# Patient Record
Sex: Female | Born: 1975 | Race: White | Hispanic: No | Marital: Married | Smoking: Never smoker
Health system: Southern US, Community
[De-identification: ages and names within clinical notes are randomized; demographics above are authoritative.]

## PROBLEM LIST (undated history)

## (undated) DIAGNOSIS — Z789 Other specified health status: Secondary | ICD-10-CM

## (undated) HISTORY — PX: MOUTH SURGERY: SHX715

## (undated) HISTORY — PX: APPENDECTOMY: SHX54

---

## 2010-06-08 ENCOUNTER — Encounter
Admission: RE | Admit: 2010-06-08 | Discharge: 2010-06-08 | Payer: Self-pay | Source: Home / Self Care | Attending: Obstetrics and Gynecology | Admitting: Obstetrics and Gynecology

## 2010-07-14 ENCOUNTER — Encounter: Payer: Self-pay | Admitting: Obstetrics and Gynecology

## 2012-01-10 LAB — OB RESULTS CONSOLE ANTIBODY SCREEN: Antibody Screen: NEGATIVE

## 2012-01-10 LAB — OB RESULTS CONSOLE ABO/RH

## 2012-01-10 LAB — OB RESULTS CONSOLE RUBELLA ANTIBODY, IGM: Rubella: IMMUNE

## 2012-01-10 LAB — OB RESULTS CONSOLE HEPATITIS B SURFACE ANTIGEN: Hepatitis B Surface Ag: NEGATIVE

## 2012-06-11 ENCOUNTER — Inpatient Hospital Stay (HOSPITAL_COMMUNITY)
Admission: AD | Admit: 2012-06-11 | Discharge: 2012-06-11 | Disposition: A | Discharge status: Home / Self Care | Payer: BC Managed Care – PPO | Source: Ambulatory Visit | Attending: Obstetrics and Gynecology | Admitting: Obstetrics and Gynecology

## 2012-06-11 ENCOUNTER — Encounter (HOSPITAL_COMMUNITY): Payer: Self-pay | Admitting: *Deleted

## 2012-06-11 ENCOUNTER — Inpatient Hospital Stay (HOSPITAL_COMMUNITY): Payer: BC Managed Care – PPO

## 2012-06-11 DIAGNOSIS — Z331 Pregnant state, incidental: Secondary | ICD-10-CM

## 2012-06-11 DIAGNOSIS — W108XXA Fall (on) (from) other stairs and steps, initial encounter: Secondary | ICD-10-CM | POA: Insufficient documentation

## 2012-06-11 DIAGNOSIS — S7000XA Contusion of unspecified hip, initial encounter: Secondary | ICD-10-CM

## 2012-06-11 DIAGNOSIS — S60219A Contusion of unspecified wrist, initial encounter: Secondary | ICD-10-CM

## 2012-06-11 DIAGNOSIS — O99891 Other specified diseases and conditions complicating pregnancy: Secondary | ICD-10-CM | POA: Insufficient documentation

## 2012-06-11 LAB — CBC
HCT: 31.6 % — ABNORMAL LOW (ref 36.0–46.0)
MCV: 91.6 fL (ref 78.0–100.0)
Platelets: 215 10*3/uL (ref 150–400)
RBC: 3.45 MIL/uL — ABNORMAL LOW (ref 3.87–5.11)
RDW: 12.7 % (ref 11.5–15.5)
WBC: 14 10*3/uL — ABNORMAL HIGH (ref 4.0–10.5)

## 2012-06-11 LAB — KLEIHAUER-BETKE STAIN: Quantitation Fetal Hemoglobin: 0 mL

## 2012-06-11 NOTE — MAU Note (Signed)
Fell down the stairs (10 steps) this Am @ 0900;

## 2012-06-11 NOTE — MAU Provider Note (Signed)
  History     CSN: [id number]  Arrival date and time: 06/11/12 1308   None     No chief complaint on file.  HPI  Pt is G1P0 at [redacted] weeks pregnant and fell down about 10 steps this morning.  She hit her right hip and right wrist and left knee.  Pt does not think she hit her abdomen. Pt denies spotting or bleeding. LOF or cramping.She was seen at the office this morning and saw Dr. Rana Snare.    No past medical history on file.  No past surgical history on file.  No family history on file.  History  Substance Use Topics  . Smoking status: Not on file  . Smokeless tobacco: Not on file  . Alcohol Use: Not on file    Allergies: Allergies not on file  No prescriptions prior to admission    Review of Systems  Constitutional: Negative for fever and chills.  HENT: Negative for neck pain.   Gastrointestinal: Negative for nausea, vomiting and abdominal pain.  Genitourinary: Negative for dysuria, urgency, frequency and hematuria.  Musculoskeletal: Positive for back pain.  Neurological: Negative for dizziness.   Physical Exam   There were no vitals taken for this visit.  Physical Exam  Vitals reviewed. Constitutional: She is oriented to person, place, and time. She appears well-developed and well-nourished.  HENT:  Head: Normocephalic and atraumatic.  Eyes: Pupils are equal, round, and reactive to light.  Neck: Normal range of motion. Neck supple.  Cardiovascular: Normal rate.   Respiratory: Effort normal.  GI: Soft. She exhibits no distension. There is no tenderness. There is no rebound and no guarding.  Musculoskeletal: Normal range of motion.  Neurological: She is alert and oriented to person, place, and time.  Skin: Skin is warm and dry.  Psychiatric: She has a normal mood and affect.    MAU Course  Procedures  Discussed with Dr. Rana Snare- will continue to monitor pt for the 4 hours Results for orders placed during the hospital encounter of 06/11/12 (from the past 24  hour(s))  CBC     Status: Abnormal   Collection Time   06/11/12  1:41 PM      Component Value Range   WBC 14.0 (*) 4.0 - 10.5 K/uL   RBC 3.45 (*) 3.87 - 5.11 MIL/uL   Hemoglobin 10.7 (*) 12.0 - 15.0 g/dL   HCT 40.9 (*) 81.1 - 91.4 %   MCV 91.6  78.0 - 100.0 fL   MCH 31.0  26.0 - 34.0 pg   MCHC 33.9  30.0 - 36.0 g/dL   RDW 78.2  95.6 - 21.3 %   Platelets 215  150 - 400 K/uL  KLEIHAUER-BETKE STAIN     Status: Normal   Collection Time   06/11/12  1:41 PM      Component Value Range   Fetal Cells % 0.0     Quantitation Fetal Hemoglobin 0    Fetal monitoring for 4 hours- occ ctx; FHR reassuring for gestational age Pt's right hip is sore and right wrist Pt does not want to take any medication for the pain  Assessment and Plan  Fall down steps Normal ultrasound Neg KB stain Tylenol prn F/u with Dr. Rana Snare for [facility] appointment tomorrow- call if increase in pain Discharge instructions given on fall in pregnancy and kick counts  LINEBERRY,SUSAN 06/11/2012, 1:38 PM

## 2012-07-29 ENCOUNTER — Encounter (HOSPITAL_COMMUNITY): Payer: Self-pay | Admitting: Pharmacist

## 2012-08-04 NOTE — H&P (Signed)
Valerie Henson  DICTATION # [id number] CSN# [id number]   Meriel Pica, MD 08/04/2012 12:34 PM

## 2012-08-05 NOTE — H&P (Signed)
Valerie Henson, RUDNICK               ACCOUNT NO.:  [id number]  MEDICAL RECORD NO.:  [id number]  LOCATION:                                 FACILITY:  PHYSICIAN:  Duke Salvia. Marcelle Overlie, M.D.DATE OF BIRTH:  21-Oct-1975  DATE OF ADMISSION: DATE OF DISCHARGE:                             HISTORY & PHYSICAL   CHIEF COMPLAINT:  Primary cesarean section at term, history of HSV.  HISTORY:  A 37 year old, G1, P0, EDD August 15, 2012.  This patient has had a normal maternal T21 genetic screen and CF screen, otherwise uncomplicated pregnancy, has been on oral Valtrex daily for HSV suppression.  Despite, no active outbreaks with this pregnancy.  She has a strong preference for primary cesarean section, mainly because of concerns about HSV.  Specific risks related to bleeding, infection, transfusion, adjacent organ injury, wound infection, phlebitis along with her expected recovery time reviewed with her.  She remains firm in her decision to proceed with primary cesarean section.  PAST MEDICAL HISTORY:  Allergies none.  Current medications; Valtrex, prenatal vitamins.  Please see the [facility] form for the remainder of her past medical history.  PHYSICAL EXAMINATION:  VITAL SIGNS:  Temperature 98.2, blood pressure 120/78. HEENT:  Unremarkable. NECK:  Supple without masses. LUNGS:  Clear. CARDIOVASCULAR:  Regular rate and rhythm without murmurs, rubs, gallops. BREASTS:  Without masses. ABDOMEN:  Term fundal height.  Fetal heart rate 140.  Cervix was closed, bulbar exam unremarkable. EXTREMITIES:  Unremarkable. NEUROLOGIC:  Unremarkable.  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  PLAN:  Primary CS procedure and risks discussed as above.     Takeela Peil M. Marcelle Overlie, M.D.     [facility]/MEDQ  D:  08/04/2012  T:  08/04/2012  Job:  [id number]

## 2012-08-07 ENCOUNTER — Encounter (HOSPITAL_COMMUNITY): Payer: Self-pay

## 2012-08-10 ENCOUNTER — Encounter (HOSPITAL_COMMUNITY)
Admission: RE | Admit: 2012-08-10 | Discharge: 2012-08-10 | Disposition: A | Discharge status: Home / Self Care | Payer: BC Managed Care – PPO | Source: Ambulatory Visit | Attending: Obstetrics and Gynecology | Admitting: Obstetrics and Gynecology

## 2012-08-10 ENCOUNTER — Encounter (HOSPITAL_COMMUNITY): Payer: Self-pay

## 2012-08-10 HISTORY — DX: Other specified health status: Z78.9

## 2012-08-10 LAB — TYPE AND SCREEN: ABO/RH(D): A POS

## 2012-08-10 LAB — CBC
MCH: 28.9 pg (ref 26.0–34.0)
MCHC: 32.8 g/dL (ref 30.0–36.0)
RDW: 14.2 % (ref 11.5–15.5)

## 2012-08-10 LAB — RPR: RPR Ser Ql: NONREACTIVE

## 2012-08-10 NOTE — Patient Instructions (Addendum)
   Your procedure is scheduled on:Thursday February 20th  Enter through the Main Entrance of [facility] at:6am Pick up the phone at the desk and dial [phone] and inform us of your arrival.  Please call this number if you have any problems the morning of surgery: [phone]  Remember: Do not eat or drink anything after midnight on Wednesday :  Do not wear jewelry, make-up, or FINGER nail polish No metal in your hair or on your body. Do not wear lotions, powders, perfumes. You may wear deodorant.  Please use your CHG wash as directed prior to surgery.  Do not shave anywhere for at least 12 hours prior to first CHG shower.  Do not bring valuables to the hospital.   Leave suitcase in the car. After Surgery it may be brought to your room. For patients being admitted to the hospital, checkout time is 11:00am the day of discharge.      

## 2012-08-12 ENCOUNTER — Encounter (HOSPITAL_COMMUNITY): Payer: Self-pay | Admitting: Anesthesiology

## 2012-08-12 NOTE — Anesthesia Preprocedure Evaluation (Addendum)
Anesthesia Evaluation  Patient identified by MRN, date of birth, ID band Patient awake    Reviewed: Allergy & Precautions, H&P , NPO status , Patient's Chart, lab work & pertinent test results  Airway Mallampati: II TM Distance: >3 FB Neck ROM: Full    Dental no notable dental hx. (+) Teeth Intact   Pulmonary neg pulmonary ROS,  breath sounds clear to auscultation  Pulmonary exam normal       Cardiovascular negative cardio ROS  Rhythm:Regular Rate:Normal     Neuro/Psych negative neurological ROS  negative psych ROS   GI/Hepatic negative GI ROS, Neg liver ROS,   Endo/Other  negative endocrine ROS  Renal/GU negative Renal ROS  negative genitourinary   Musculoskeletal negative musculoskeletal ROS (+)   Abdominal Normal abdominal exam  (+)   Peds  Hematology negative hematology ROS (+)   Anesthesia Other Findings   Reproductive/Obstetrics (+) Pregnancy HSV Maternal request for C/Section                          Anesthesia Physical Anesthesia Plan  ASA: II  Anesthesia Plan: Spinal   Post-op Pain Management:    Induction:   Airway Management Planned: Natural Airway  Additional Equipment:   Intra-op Plan:   Post-operative Plan:   Informed Consent: I have reviewed the patients History and Physical, chart, labs and discussed the procedure including the risks, benefits and alternatives for the proposed anesthesia with the patient or authorized representative who has indicated his/her understanding and acceptance.   Dental advisory given  Plan Discussed with: Anesthesiologist, CRNA and Surgeon  Anesthesia Plan Comments:        Anesthesia Quick Evaluation

## 2012-08-13 ENCOUNTER — Inpatient Hospital Stay (HOSPITAL_COMMUNITY)
Admission: AD | Admit: 2012-08-13 | Discharge: 2012-08-16 | DRG: 371 | Disposition: A | Discharge status: Home / Self Care | Payer: BC Managed Care – PPO | Source: Ambulatory Visit | Attending: Obstetrics and Gynecology | Admitting: Obstetrics and Gynecology

## 2012-08-13 ENCOUNTER — Inpatient Hospital Stay (HOSPITAL_COMMUNITY): Payer: BC Managed Care – PPO | Admitting: Anesthesiology

## 2012-08-13 ENCOUNTER — Encounter (HOSPITAL_COMMUNITY)
Admission: AD | Disposition: A | Discharge status: Home / Self Care | Payer: Self-pay | Source: Ambulatory Visit | Attending: Obstetrics and Gynecology

## 2012-08-13 ENCOUNTER — Encounter (HOSPITAL_COMMUNITY): Payer: Self-pay | Admitting: Anesthesiology

## 2012-08-13 ENCOUNTER — Encounter (HOSPITAL_COMMUNITY): Payer: Self-pay | Admitting: *Deleted

## 2012-08-13 DIAGNOSIS — A6000 Herpesviral infection of urogenital system, unspecified: Secondary | ICD-10-CM | POA: Diagnosis present

## 2012-08-13 DIAGNOSIS — O98519 Other viral diseases complicating pregnancy, unspecified trimester: Principal | ICD-10-CM | POA: Diagnosis present

## 2012-08-13 SURGERY — Surgical Case
Anesthesia: Spinal | Site: Abdomen | Wound class: Clean Contaminated

## 2012-08-13 MED ORDER — NALBUPHINE HCL 10 MG/ML IJ SOLN
5.0000 mg | INTRAMUSCULAR | Status: DC | PRN
  Administered 2012-08-13: 5 mg via SUBCUTANEOUS
  Filled 2012-08-13: qty 1

## 2012-08-13 MED ORDER — SIMETHICONE 80 MG PO CHEW
80.0000 mg | CHEWABLE_TABLET | Freq: Three times a day (TID) | ORAL | Status: DC
  Administered 2012-08-13 – 2012-08-16 (×8): 80 mg via ORAL

## 2012-08-13 MED ORDER — ONDANSETRON HCL 4 MG/2ML IJ SOLN
INTRAMUSCULAR | Status: DC | PRN
  Administered 2012-08-13: 4 mg via INTRAVENOUS

## 2012-08-13 MED ORDER — MENTHOL 3 MG MT LOZG: 1.0000 | LOZENGE | OROMUCOSAL | Status: DC | PRN

## 2012-08-13 MED ORDER — 0.9 % SODIUM CHLORIDE (POUR BTL) OPTIME
TOPICAL | Status: DC | PRN
  Administered 2012-08-13: 1000 mL

## 2012-08-13 MED ORDER — DIBUCAINE 1 % RE OINT
1.0000 "application " | TOPICAL_OINTMENT | RECTAL | Status: DC | PRN
  Administered 2012-08-15: 1 via RECTAL
  Filled 2012-08-13: qty 28

## 2012-08-13 MED ORDER — FENTANYL CITRATE 0.05 MG/ML IJ SOLN: 25.0000 ug | INTRAMUSCULAR | Status: DC | PRN

## 2012-08-13 MED ORDER — EPHEDRINE SULFATE 50 MG/ML IJ SOLN
INTRAMUSCULAR | Status: DC | PRN
  Administered 2012-08-13: 5 mg via INTRAVENOUS
  Administered 2012-08-13 (×2): 10 mg via INTRAVENOUS

## 2012-08-13 MED ORDER — PHENYLEPHRINE HCL 10 MG/ML IJ SOLN
INTRAMUSCULAR | Status: DC | PRN
  Administered 2012-08-13 (×2): 80 ug via INTRAVENOUS

## 2012-08-13 MED ORDER — TETANUS-DIPHTH-ACELL PERTUSSIS 5-2.5-18.5 LF-MCG/0.5 IM SUSP: 0.5000 mL | Freq: Once | INTRAMUSCULAR | Status: DC

## 2012-08-13 MED ORDER — IBUPROFEN 800 MG PO TABS
800.0000 mg | ORAL_TABLET | Freq: Three times a day (TID) | ORAL | Status: DC | PRN
  Administered 2012-08-14: 800 mg via ORAL
  Filled 2012-08-13: qty 1

## 2012-08-13 MED ORDER — LACTATED RINGERS IV SOLN
INTRAVENOUS | Status: DC | PRN
  Administered 2012-08-13: 08:00:00 via INTRAVENOUS

## 2012-08-13 MED ORDER — NALOXONE HCL 0.4 MG/ML IJ SOLN: 0.4000 mg | INTRAMUSCULAR | Status: DC | PRN

## 2012-08-13 MED ORDER — FENTANYL CITRATE 0.05 MG/ML IJ SOLN
INTRAMUSCULAR | Status: DC | PRN
  Administered 2012-08-13: 25 ug via INTRATHECAL

## 2012-08-13 MED ORDER — BISACODYL 10 MG RE SUPP
10.0000 mg | Freq: Every day | RECTAL | Status: DC | PRN
  Administered 2012-08-15: 10 mg via RECTAL
  Filled 2012-08-13: qty 1

## 2012-08-13 MED ORDER — KETOROLAC TROMETHAMINE 30 MG/ML IJ SOLN
INTRAMUSCULAR | Status: AC
  Administered 2012-08-13: 30 mg via INTRAMUSCULAR
  Filled 2012-08-13: qty 1

## 2012-08-13 MED ORDER — BUPIVACAINE IN DEXTROSE 0.75-8.25 % IT SOLN
INTRATHECAL | Status: DC | PRN
  Administered 2012-08-13: 1.6 mL via INTRATHECAL

## 2012-08-13 MED ORDER — CEFAZOLIN SODIUM-DEXTROSE 2-3 GM-% IV SOLR
2.0000 g | INTRAVENOUS | Status: AC
  Administered 2012-08-13: 2 g via INTRAVENOUS

## 2012-08-13 MED ORDER — NALOXONE HCL 1 MG/ML IJ SOLN
1.0000 ug/kg/h | INTRAVENOUS | Status: DC | PRN
  Filled 2012-08-13: qty 2

## 2012-08-13 MED ORDER — OXYTOCIN 40 UNITS IN LACTATED RINGERS INFUSION - SIMPLE MED: 62.5000 mL/h | INTRAVENOUS | Status: AC

## 2012-08-13 MED ORDER — MEASLES, MUMPS & RUBELLA VAC ~~LOC~~ INJ: 0.5000 mL | INJECTION | Freq: Once | SUBCUTANEOUS | Status: DC

## 2012-08-13 MED ORDER — LACTATED RINGERS IV SOLN
INTRAVENOUS | Status: DC
  Administered 2012-08-13: 07:00:00 via INTRAVENOUS
  Administered 2012-08-13: 1000 mL via INTRAVENOUS
  Administered 2012-08-13: 08:00:00 via INTRAVENOUS

## 2012-08-13 MED ORDER — MORPHINE SULFATE 0.5 MG/ML IJ SOLN
INTRAMUSCULAR | Status: AC
  Filled 2012-08-13: qty 10

## 2012-08-13 MED ORDER — SCOPOLAMINE 1 MG/3DAYS TD PT72
MEDICATED_PATCH | TRANSDERMAL | Status: AC
  Filled 2012-08-13: qty 1

## 2012-08-13 MED ORDER — IBUPROFEN 600 MG PO TABS
600.0000 mg | ORAL_TABLET | Freq: Four times a day (QID) | ORAL | Status: DC | PRN
  Administered 2012-08-15 – 2012-08-16 (×6): 600 mg via ORAL
  Filled 2012-08-13 (×8): qty 1

## 2012-08-13 MED ORDER — DIPHENHYDRAMINE HCL 25 MG PO CAPS
25.0000 mg | ORAL_CAPSULE | ORAL | Status: DC | PRN
  Filled 2012-08-13: qty 1

## 2012-08-13 MED ORDER — OXYTOCIN 40 UNITS IN LACTATED RINGERS INFUSION - SIMPLE MED
INTRAVENOUS | Status: DC | PRN
  Administered 2012-08-13: 40 [IU] via INTRAVENOUS

## 2012-08-13 MED ORDER — METOCLOPRAMIDE HCL 5 MG/ML IJ SOLN: 10.0000 mg | Freq: Three times a day (TID) | INTRAMUSCULAR | Status: DC | PRN

## 2012-08-13 MED ORDER — MORPHINE SULFATE (PF) 0.5 MG/ML IJ SOLN
INTRAMUSCULAR | Status: DC | PRN
  Administered 2012-08-13: .1 mg via INTRATHECAL

## 2012-08-13 MED ORDER — DIPHENHYDRAMINE HCL 50 MG/ML IJ SOLN: 25.0000 mg | INTRAMUSCULAR | Status: DC | PRN

## 2012-08-13 MED ORDER — SENNOSIDES-DOCUSATE SODIUM 8.6-50 MG PO TABS
2.0000 | ORAL_TABLET | Freq: Every day | ORAL | Status: DC
  Administered 2012-08-13 – 2012-08-15 (×3): 2 via ORAL

## 2012-08-13 MED ORDER — ONDANSETRON HCL 4 MG/2ML IJ SOLN: 4.0000 mg | Freq: Three times a day (TID) | INTRAMUSCULAR | Status: DC | PRN

## 2012-08-13 MED ORDER — OXYTOCIN 10 UNIT/ML IJ SOLN
INTRAMUSCULAR | Status: AC
  Filled 2012-08-13: qty 4

## 2012-08-13 MED ORDER — LACTATED RINGERS IV SOLN
INTRAVENOUS | Status: DC
  Administered 2012-08-13: 17:00:00 via INTRAVENOUS

## 2012-08-13 MED ORDER — PRENATAL MULTIVITAMIN CH
1.0000 | ORAL_TABLET | Freq: Every day | ORAL | Status: DC
  Administered 2012-08-14 – 2012-08-16 (×3): 1 via ORAL
  Filled 2012-08-13 (×3): qty 1

## 2012-08-13 MED ORDER — DIPHENHYDRAMINE HCL 50 MG/ML IJ SOLN: 12.5000 mg | INTRAMUSCULAR | Status: DC | PRN

## 2012-08-13 MED ORDER — WITCH HAZEL-GLYCERIN EX PADS
1.0000 "application " | MEDICATED_PAD | CUTANEOUS | Status: DC | PRN
  Administered 2012-08-15: 1 via TOPICAL

## 2012-08-13 MED ORDER — SODIUM CHLORIDE 0.9 % IJ SOLN: 3.0000 mL | INTRAMUSCULAR | Status: DC | PRN

## 2012-08-13 MED ORDER — SIMETHICONE 80 MG PO CHEW: 80.0000 mg | CHEWABLE_TABLET | ORAL | Status: DC | PRN

## 2012-08-13 MED ORDER — DIPHENHYDRAMINE HCL 25 MG PO CAPS: 25.0000 mg | ORAL_CAPSULE | Freq: Four times a day (QID) | ORAL | Status: DC | PRN

## 2012-08-13 MED ORDER — SODIUM CHLORIDE 0.9 % IJ SOLN: 3.0000 mL | Freq: Two times a day (BID) | INTRAMUSCULAR | Status: DC

## 2012-08-13 MED ORDER — ONDANSETRON HCL 4 MG/2ML IJ SOLN
INTRAMUSCULAR | Status: AC
  Filled 2012-08-13: qty 2

## 2012-08-13 MED ORDER — NALBUPHINE SYRINGE 5 MG/0.5 ML
INJECTION | INTRAMUSCULAR | Status: AC
  Administered 2012-08-13: 5 mg via SUBCUTANEOUS
  Filled 2012-08-13: qty 1

## 2012-08-13 MED ORDER — FENTANYL CITRATE 0.05 MG/ML IJ SOLN
INTRAMUSCULAR | Status: AC
  Filled 2012-08-13: qty 2

## 2012-08-13 MED ORDER — SCOPOLAMINE 1 MG/3DAYS TD PT72
1.0000 | MEDICATED_PATCH | Freq: Once | TRANSDERMAL | Status: DC
  Administered 2012-08-13: 1.5 mg via TRANSDERMAL

## 2012-08-13 MED ORDER — FLEET ENEMA 7-19 GM/118ML RE ENEM: 1.0000 | ENEMA | Freq: Every day | RECTAL | Status: DC | PRN

## 2012-08-13 MED ORDER — LANOLIN HYDROUS EX OINT: 1.0000 "application " | TOPICAL_OINTMENT | CUTANEOUS | Status: DC | PRN

## 2012-08-13 MED ORDER — KETOROLAC TROMETHAMINE 30 MG/ML IJ SOLN
30.0000 mg | Freq: Four times a day (QID) | INTRAMUSCULAR | Status: DC | PRN
  Administered 2012-08-13: 30 mg via INTRAMUSCULAR

## 2012-08-13 MED ORDER — NALBUPHINE HCL 10 MG/ML IJ SOLN
5.0000 mg | INTRAMUSCULAR | Status: DC | PRN
  Filled 2012-08-13: qty 1

## 2012-08-13 MED ORDER — SCOPOLAMINE 1 MG/3DAYS TD PT72: 1.0000 | MEDICATED_PATCH | Freq: Once | TRANSDERMAL | Status: DC

## 2012-08-13 MED ORDER — OXYCODONE-ACETAMINOPHEN 5-325 MG PO TABS
1.0000 | ORAL_TABLET | Freq: Four times a day (QID) | ORAL | Status: DC | PRN
  Filled 2012-08-13: qty 1

## 2012-08-13 MED ORDER — ACETAMINOPHEN 10 MG/ML IV SOLN
1000.0000 mg | Freq: Four times a day (QID) | INTRAVENOUS | Status: AC
  Filled 2012-08-13 (×4): qty 100

## 2012-08-13 MED ORDER — PHENYLEPHRINE 40 MCG/ML (10ML) SYRINGE FOR IV PUSH (FOR BLOOD PRESSURE SUPPORT)
PREFILLED_SYRINGE | INTRAVENOUS | Status: AC
  Filled 2012-08-13: qty 5

## 2012-08-13 MED ORDER — CEFAZOLIN SODIUM-DEXTROSE 2-3 GM-% IV SOLR
INTRAVENOUS | Status: AC
  Filled 2012-08-13: qty 50

## 2012-08-13 MED ORDER — ZOLPIDEM TARTRATE 5 MG PO TABS: 5.0000 mg | ORAL_TABLET | Freq: Every evening | ORAL | Status: DC | PRN

## 2012-08-13 MED ORDER — MEPERIDINE HCL 25 MG/ML IJ SOLN: 6.2500 mg | INTRAMUSCULAR | Status: DC | PRN

## 2012-08-13 MED ORDER — SODIUM CHLORIDE 0.9 % IV SOLN: 250.0000 mL | INTRAVENOUS | Status: DC

## 2012-08-13 MED ORDER — EPHEDRINE 5 MG/ML INJ
INTRAVENOUS | Status: AC
  Filled 2012-08-13: qty 10

## 2012-08-13 SURGICAL SUPPLY — 27 items
CLOTH BEACON ORANGE TIMEOUT ST (SAFETY) ×2 IMPLANT
DRAPE LG THREE QUARTER DISP (DRAPES) ×2 IMPLANT
DRESSING TELFA 8X3 (GAUZE/BANDAGES/DRESSINGS) IMPLANT
DRSG OPSITE POSTOP 4X10 (GAUZE/BANDAGES/DRESSINGS) ×2 IMPLANT
DURAPREP 26ML APPLICATOR (WOUND CARE) ×2 IMPLANT
ELECT REM PT RETURN 9FT ADLT (ELECTROSURGICAL) ×2
ELECTRODE REM PT RTRN 9FT ADLT (ELECTROSURGICAL) ×1 IMPLANT
EXTRACTOR VACUUM M CUP 4 TUBE (SUCTIONS) IMPLANT
GAUZE SPONGE 4X4 12PLY STRL LF (GAUZE/BANDAGES/DRESSINGS) IMPLANT
GLOVE BIO SURGEON STRL SZ7 (GLOVE) ×4 IMPLANT
GOWN PREVENTION PLUS LG XLONG (DISPOSABLE) ×6 IMPLANT
KIT ABG SYR 3ML LUER SLIP (SYRINGE) IMPLANT
NEEDLE HYPO 25X5/8 SAFETYGLIDE (NEEDLE) IMPLANT
NS IRRIG 1000ML POUR BTL (IV SOLUTION) ×2 IMPLANT
PACK C SECTION WH (CUSTOM PROCEDURE TRAY) ×2 IMPLANT
PAD ABD 7.5X8 STRL (GAUZE/BANDAGES/DRESSINGS) IMPLANT
PAD OB MATERNITY 4.3X12.25 (PERSONAL CARE ITEMS) ×2 IMPLANT
SLEEVE SCD COMPRESS KNEE MED (MISCELLANEOUS) IMPLANT
STRIP CLOSURE SKIN 1/2X4 (GAUZE/BANDAGES/DRESSINGS) IMPLANT
SUT CHROMIC 0 CTX 36 (SUTURE) ×8 IMPLANT
SUT MON AB 4-0 PS1 27 (SUTURE) ×2 IMPLANT
SUT PDS AB 0 CT1 27 (SUTURE) ×4 IMPLANT
SUT VIC AB 3-0 CT1 27 (SUTURE) ×2
SUT VIC AB 3-0 CT1 TAPERPNT 27 (SUTURE) ×2 IMPLANT
TOWEL OR 17X24 6PK STRL BLUE (TOWEL DISPOSABLE) ×6 IMPLANT
TRAY FOLEY CATH 14FR (SET/KITS/TRAYS/PACK) ×2 IMPLANT
WATER STERILE IRR 1000ML POUR (IV SOLUTION) IMPLANT

## 2012-08-13 NOTE — Anesthesia Procedure Notes (Signed)
Spinal  Patient location during procedure: OR Start time: 08/13/2012 7:23 AM Staffing Anesthesiologist: Angus Seller., Harrell Gave. Performed by: anesthesiologist  Preanesthetic Checklist Completed: patient identified, site marked, surgical consent, pre-op evaluation, timeout performed, IV checked, risks and benefits discussed and monitors and equipment checked Spinal Block Patient position: sitting Prep: DuraPrep Patient monitoring: heart rate, cardiac monitor, continuous pulse ox and blood pressure Approach: midline Location: L3-4 Injection technique: single-shot Needle Needle type: Sprotte  Needle gauge: 24 G Needle length: 9 cm Assessment Sensory level: T4 Additional Notes Patient identified.  Risk benefits discussed including failed block, incomplete pain control, headache, nerve damage, paralysis, blood pressure changes, nausea, vomiting, reactions to medication both toxic or allergic, and postpartum back pain.  Patient expressed understanding and wished to proceed.  All questions were answered.  Sterile technique used throughout procedure.  CSF was clear.  No parasthesia or other complications.  Please see nursing notes for vital signs.

## 2012-08-13 NOTE — Anesthesia Postprocedure Evaluation (Signed)
  Anesthesia Post Note  Patient: Valerie Henson  Procedure(s) Performed: Procedure(s) (LRB): CESAREAN SECTION (N/A)  Anesthesia type: Spinal  Patient location: PACU  Post pain: Pain level controlled  Post assessment: Post-op Vital signs reviewed  Last Vitals:  Filed Vitals:   08/13/12 0621  BP: 112/73  Pulse: 77  Temp: 37 C    Post vital signs: Reviewed  Level of consciousness: awake  Complications: No apparent anesthesia complications

## 2012-08-13 NOTE — Transfer of Care (Signed)
Immediate Anesthesia Transfer of Care Note  Patient: Valerie Henson  Procedure(s) Performed: Procedure(s) with comments: CESAREAN SECTION (N/A) - Primary Cesarean Section Delivery Baby     @    , Apgars  Patient Location: PACU  Anesthesia Type:Spinal  Level of Consciousness: awake, alert  and oriented  Airway & Oxygen Therapy: Patient Spontanous Breathing  Post-op Assessment: Report given to PACU RN and Post -op Vital signs reviewed and stable  Post vital signs: Reviewed and stable  Complications: No apparent anesthesia complications

## 2012-08-13 NOTE — Progress Notes (Signed)
The patient was re-examined with no change in status 

## 2012-08-13 NOTE — Anesthesia Postprocedure Evaluation (Signed)
  Anesthesia Post-op Note  Patient: Valerie Henson  Procedure(s) Performed: Procedure(s) with comments: CESAREAN SECTION (N/A) - Primary Cesarean Section Delivery Baby Girl  @ [phone],    Apgars 9/9  Patient Location: PACU and Mother/Baby  Anesthesia Type:Spinal  Level of Consciousness: awake, alert , oriented and patient cooperative  Airway and Oxygen Therapy: Patient Spontanous Breathing  Post-op Pain: none  Post-op Assessment: Post-op Vital signs reviewed, No signs of Nausea or vomiting, Adequate PO intake, Pain level controlled, No headache, No backache, No residual numbness and No residual motor weakness  Post-op Vital Signs: Reviewed and stable  Complications: No apparent anesthesia complications

## 2012-08-13 NOTE — Op Note (Signed)
Preoperative diagnosis: Term pregnancy, history of HSV, patient preference for cesarean delivery  Postoperative diagnosis: Same  Procedure: Primary low transverse cesarean section  Surgeon: Marcelle Overlie  Anesthesia: Spinal  EBL: 700 cc  Drains: Foley catheter  Procedure and findings:  The patient taken the operating room after an adequate level of spinal anesthetic was obtainedin left tilt position the abdomen prepped and draped in usual fashion with the patient for cesarean section. She did receive preop IV Ancef. Appropriate timeout for taken at that point. Transverse Pfannenstiel incision made 2 finger breaths above the symphysis carried down to the fascia which was incised and extended transversely. Rectus muscle divided in the midline peritoneum entered superiorly without incident and extended in a vertical fashion. The vesicouterine serosa was incised and the bladder was bluntly and sharply dissected below. Bladder blade was repositioned. Transverse incision made in the lower uterine segment extended with bandage scissors clear fluid noted the patient delivered of a healthy female Apgars 9 and 9 loose nuchal cord times one from the vertex presentation. The infant was suctioned cord clamped and passed the pediatric team for further care. Cord blood was then obtained for private CBR per patient's request. Once this was completed placenta was then removed manually intact, uterus exteriorized cavity wiped clean with laparotomy pack closure obtained with a first layer of 0 chromic in a locked fashion followed by Dimetane layer of chromic. This is hemostatic bilateral tubes and ovaries were normal. The bladder flap area was intact and hemostatic. Prior to closure sponge denies precast reported as correct x2. Peritoneum closed the running 2-0 Vicryl suture. 2-0 Vicryl running suture used to close the rectus muscles in the midline. 0 PDS suture was then used to close the fascia from laterally to midline on  either side. Subcutaneous tissue was less than 2 cm and was hemostatic 4-0 Monocryl subcuticular closure with sterile dressing applied she tolerated this well went to recovery room in good condition.  Dictated with [facility]  Taydem Cavagnaro M. Milana Obey.D.

## 2012-08-14 ENCOUNTER — Encounter (HOSPITAL_COMMUNITY): Payer: Self-pay | Admitting: Obstetrics and Gynecology

## 2012-08-14 LAB — CBC
Hemoglobin: 10.8 g/dL — ABNORMAL LOW (ref 12.0–15.0)
MCH: 28.5 pg (ref 26.0–34.0)
MCHC: 32.6 g/dL (ref 30.0–36.0)
RDW: 14.4 % (ref 11.5–15.5)

## 2012-08-14 NOTE — Progress Notes (Signed)
Subjective: Postpartum Day 1: Cesarean Delivery Patient reports tolerating PO, + flatus and no problems voiding.    Objective: Vital signs in last 24 hours: Temp:  [97.7 F (36.5 C)-99.7 F (37.6 C)] 99 F (37.2 C) (02/21 0556) Pulse Rate:  [58-92] 62 (02/21 0556) Resp:  [16-20] 18 (02/21 0556) BP: (95-115)/(32-87) 96/59 mmHg (02/21 0556) SpO2:  [94 %-99 %] 94 % (02/21 0556)  Physical Exam:  General: alert and cooperative Lochia: appropriate Uterine Fundus: firm Incision: honeycomb dressing CDI DVT Evaluation: No evidence of DVT seen on physical exam. Negative Homan's sign. No cords or calf tenderness. No significant calf/ankle edema.   Recent Labs  08/14/12 0500  HGB 10.8*  HCT 33.1*    Assessment/Plan: Status post Cesarean section. Doing well postoperatively.  Continue current care.  Alexa Blish G 08/14/2012, 7:57 AM

## 2012-08-15 MED ORDER — MAGNESIUM OXIDE 400 (241.3 MG) MG PO TABS
400.0000 mg | ORAL_TABLET | Freq: Every day | ORAL | Status: DC
  Administered 2012-08-15: 400 mg via ORAL
  Filled 2012-08-15 (×2): qty 1

## 2012-08-15 NOTE — Progress Notes (Signed)
Subjective: Postpartum Day 2: Cesarean Delivery Patient reports tolerating PO and no problems voiding.    Objective: Vital signs in last 24 hours: Temp:  [97.9 F (36.6 C)-98.6 F (37 C)] 97.9 F (36.6 C) (02/22 0619) Pulse Rate:  [59-85] 59 (02/22 0619) Resp:  [16-18] 18 (02/22 0619) BP: (106-110)/(74-77) 110/74 mmHg (02/22 0619) SpO2:  [95 %-98 %] 98 % (02/22 7829)  Physical Exam:  General: alert and cooperative Lochia: appropriate Uterine Fundus: firm Incision: healing well, no significant drainage DVT Evaluation: No evidence of DVT seen on physical exam.   Recent Labs  08/14/12 0500  HGB 10.8*  HCT 33.1*    Assessment/Plan: Status post Cesarean section. Doing well postoperatively.  Continue current care.  Ani Deoliveira 08/15/2012, 6:45 AM

## 2012-08-16 MED ORDER — IBUPROFEN 600 MG PO TABS: 600.0000 mg | ORAL_TABLET | Freq: Four times a day (QID) | ORAL | Status: AC | PRN

## 2012-08-16 MED ORDER — OXYCODONE-ACETAMINOPHEN 5-325 MG PO TABS: 1.0000 | ORAL_TABLET | Freq: Four times a day (QID) | ORAL | Status: AC | PRN

## 2012-08-16 MED ORDER — PRENATAL MULTIVITAMIN CH: 1.0000 | ORAL_TABLET | Freq: Every day | ORAL | Status: AC

## 2012-08-16 NOTE — Discharge Summary (Signed)
Obstetric Discharge Summary Reason for Admission: cesarean section Prenatal Procedures: ultrasound Intrapartum Procedures: cesarean: low cervical, transverse Postpartum Procedures: none Complications-Operative and Postpartum: none Hemoglobin  Date Value Range Status  08/14/2012 10.8* 12.0 - 15.0 g/dL Final     HCT  Date Value Range Status  08/14/2012 33.1* 36.0 - 46.0 % Final    Physical Exam:  General: alert and cooperative Lochia: appropriate Uterine Fundus: firm Incision: healing well DVT Evaluation: No evidence of DVT seen on physical exam.  Discharge Diagnoses: Term Pregnancy-delivered  Discharge Information: Date: 08/16/2012 Activity: pelvic rest Diet: routine Medications: PNV, Ibuprofen and Percocet Condition: stable Instructions: refer to practice specific booklet Discharge to: home Follow-up Information   Schedule an appointment as soon as possible for a visit in 1 week to follow up.      Newborn Data: Live born female  Birth Weight: 7 lb (3175 g) APGAR: 9, 9  Home with mother.  Anoop Hemmer 08/16/2012, 8:59 AM

## 2014-04-25 ENCOUNTER — Encounter (HOSPITAL_COMMUNITY): Payer: Self-pay | Admitting: Obstetrics and Gynecology

## 2018-06-15 ENCOUNTER — Other Ambulatory Visit: Payer: Self-pay | Admitting: Obstetrics and Gynecology

## 2018-06-15 DIAGNOSIS — R928 Other abnormal and inconclusive findings on diagnostic imaging of breast: Secondary | ICD-10-CM

## 2018-06-25 ENCOUNTER — Ambulatory Visit
Admission: RE | Admit: 2018-06-25 | Discharge: 2018-06-25 | Disposition: A | Discharge status: Home / Self Care | Payer: Self-pay | Source: Ambulatory Visit | Attending: Obstetrics and Gynecology | Admitting: Obstetrics and Gynecology

## 2018-06-25 DIAGNOSIS — R928 Other abnormal and inconclusive findings on diagnostic imaging of breast: Secondary | ICD-10-CM

## 2018-07-21 ENCOUNTER — Ambulatory Visit: Payer: BLUE CROSS/BLUE SHIELD | Admitting: Plastic Surgery

## 2018-07-21 ENCOUNTER — Encounter: Payer: Self-pay | Admitting: Plastic Surgery

## 2018-07-21 DIAGNOSIS — M546 Pain in thoracic spine: Secondary | ICD-10-CM

## 2018-07-21 DIAGNOSIS — G8929 Other chronic pain: Secondary | ICD-10-CM | POA: Diagnosis not present

## 2018-07-21 DIAGNOSIS — M542 Cervicalgia: Secondary | ICD-10-CM | POA: Diagnosis not present

## 2018-07-21 DIAGNOSIS — M549 Dorsalgia, unspecified: Secondary | ICD-10-CM | POA: Insufficient documentation

## 2018-07-21 DIAGNOSIS — N62 Hypertrophy of breast: Secondary | ICD-10-CM

## 2018-07-21 NOTE — Progress Notes (Signed)
Patient ID: Valerie Henson, female    DOB: 12/27/1975, 43 y.o.   MRN: [id number]   Chief Complaint  Patient presents with  . Breast Problem    Mammary Hyperplasia: The patient is a 43 y.o. female with a history of mammary hyperplasia for several years.  She has extremely large breasts causing symptoms that include the following: Back pain (upper and lower) and neck pain. She frequently pins bra cups higher on straps for better lift and relief. Notices relief when holding breast up in her hands. Shoulder straps causing grooves, pain occasionally requiring padding. Pain medication is sometimes required with motrin and tylenol.  Activities that are hindered by enlarged breasts include: running and exercise.  Sometimes after a long day she gets numbness of her arms.  Her breasts are extremely large and fairly symmetric.  She has hyperpigmentation of the inframammary area on both sides.  The sternal to nipple distance on the right is 29 cm and the left is 28 cm.  The IMF distance is 15 cm.  She is 5 feet 5 inches tall and weighs 140 pounds.  Preoperative bra size = 30F cup.  The estimated excess breast tissue to be removed at the time of surgery = 450 grams on the left and 450 grams on the right.  Mammogram history: January and was negative with dense breasts.   Review of Systems  Constitutional: Positive for activity change. Negative for appetite change.  HENT: Negative.   Eyes: Negative.   Respiratory: Negative.  Negative for chest tightness.   Cardiovascular: Negative for leg swelling.  Gastrointestinal: Negative.  Negative for abdominal distention.  Endocrine: Negative.   Genitourinary: Negative.   Musculoskeletal: Positive for back pain and neck pain.  Skin: Negative.  Negative for wound.  Psychiatric/Behavioral: Negative.     Past Medical History:  Diagnosis Date  . Medical history non-contributory     Past Surgical History:  Procedure Laterality Date  . APPENDECTOMY  age 86    . CESAREAN SECTION N/A 08/13/2012   Procedure: CESAREAN SECTION;  Surgeon: Meriel Pica, MD;  Location: [facility];  Service: Obstetrics;  Laterality: N/A;  Primary Cesarean Section Delivery Baby Girl  @ [phone],    Apgars 9/9  . MOUTH SURGERY     wisdom teeth      Current Outpatient Medications:  .  ibuprofen (ADVIL,MOTRIN) 600 MG tablet, Take 1 tablet (600 mg total) by mouth every 6 (six) hours as needed., Disp: 30 tablet, Rfl: 0 .  oxyCODONE-acetaminophen (PERCOCET/ROXICET) 5-325 MG per tablet, Take 1-2 tablets by mouth every 6 (six) hours as needed., Disp: 30 tablet, Rfl: 0 .  Prenatal Vit-Fe Fumarate-FA (PRENATAL MULTIVITAMIN) TABS, Take 1 tablet by mouth daily., Disp: 30 tablet, Rfl: 12   Objective:   Vitals:   07/21/18 1356  BP: 115/75  Pulse: 79  Temp: 99.1 F (37.3 C)  SpO2: 96%    Physical Exam Vitals signs and nursing note reviewed.  Constitutional:      Appearance: Normal appearance.  HENT:     Head: Normocephalic and atraumatic.     Nose: Nose normal.     Mouth/Throat:     Mouth: Mucous membranes are moist.  Eyes:     Extraocular Movements: Extraocular movements intact.     Pupils: Pupils are equal, round, and reactive to light.  Cardiovascular:     Rate and Rhythm: Normal rate.  Pulmonary:     Effort: Pulmonary effort is normal.  Abdominal:  General: Abdomen is flat. There is no distension.     Tenderness: There is no abdominal tenderness.  Neurological:     General: No focal deficit present.     Mental Status: She is alert.  Psychiatric:        Mood and Affect: Mood normal.        Thought Content: Thought content normal.        Judgment: Judgment normal.     Assessment & Plan:  Neck pain  Chronic bilateral thoracic back pain  Symptomatic mammary hypertrophy Recommend bilateral breast reduction. We will need a copy of the mammogram report for our files. We discussed the risks and complications including the possibility of beginning ability  to breast-feed and changes in sensation.  Patient still wants to proceed.   Alena Billslaire S Lurena Naeve, DO

## 2018-09-01 ENCOUNTER — Encounter: Payer: Self-pay | Admitting: Plastic Surgery

## 2018-09-01 ENCOUNTER — Ambulatory Visit (INDEPENDENT_AMBULATORY_CARE_PROVIDER_SITE_OTHER): Payer: Self-pay | Admitting: Plastic Surgery

## 2018-09-01 VITALS — BP 123/80 | HR 83 | Temp 99.0°F | Ht 66.0 in | Wt 141.0 lb

## 2018-09-01 DIAGNOSIS — M542 Cervicalgia: Secondary | ICD-10-CM

## 2018-09-01 DIAGNOSIS — G8929 Other chronic pain: Secondary | ICD-10-CM

## 2018-09-01 DIAGNOSIS — M546 Pain in thoracic spine: Secondary | ICD-10-CM

## 2018-09-01 DIAGNOSIS — N62 Hypertrophy of breast: Secondary | ICD-10-CM

## 2018-09-01 MED ORDER — ONDANSETRON HCL 4 MG PO TABS: 4.0000 mg | ORAL_TABLET | Freq: Three times a day (TID) | ORAL | 0 refills | Status: DC | PRN

## 2018-09-01 MED ORDER — CEPHALEXIN 500 MG PO CAPS: 500.0000 mg | ORAL_CAPSULE | Freq: Four times a day (QID) | ORAL | 0 refills | Status: AC

## 2018-09-01 MED ORDER — ONDANSETRON HCL 4 MG PO TABS: 4.0000 mg | ORAL_TABLET | Freq: Three times a day (TID) | ORAL | 0 refills | Status: AC | PRN

## 2018-09-01 MED ORDER — HYDROCODONE-ACETAMINOPHEN 5-325 MG PO TABS: 1.0000 | ORAL_TABLET | Freq: Four times a day (QID) | ORAL | 0 refills | Status: DC | PRN

## 2018-09-01 MED ORDER — HYDROCODONE-ACETAMINOPHEN 5-325 MG PO TABS: 1.0000 | ORAL_TABLET | Freq: Four times a day (QID) | ORAL | 0 refills | Status: AC | PRN

## 2018-09-01 MED ORDER — CEPHALEXIN 500 MG PO CAPS: 500.0000 mg | ORAL_CAPSULE | Freq: Four times a day (QID) | ORAL | 0 refills | Status: DC

## 2018-09-01 NOTE — Progress Notes (Signed)
Patient ID: Valerie Henson, female    DOB: 25-Dec-1975, 43 y.o.   MRN: [id number]   Chief Complaint  Patient presents with  . Pre-op Exam    for (B) breast reduction  . Breast Problem    The patient is a 43 yrs old female here for a history and physical for breast reduction surgery.  She is concerned about the excess weight of her breasts.  She states back and neck pain with difficulty in exercising and activity due to the size.  She is 5 feet 5 inches tall and weight is 140 pounds.  She is currently around a 45 F bra and would like to be a C cup size.  We discussed that with a 32 circumference she could be a D and this is much different than a 34 D or 36 D in size.  We can guarantee a reduction but not guarantee a particular bra size.   The estimated grams for removal is 450 on each side.  Her last mammogram was in January.   Review of Systems  Constitutional: Negative.  Negative for activity change and appetite change.  HENT: Negative.   Eyes: Negative.   Respiratory: Negative.  Negative for chest tightness.   Cardiovascular: Negative.  Negative for leg swelling.  Gastrointestinal: Negative.   Endocrine: Negative.   Genitourinary: Negative.   Musculoskeletal: Negative.  Negative for joint swelling.  Hematological: Negative.   Psychiatric/Behavioral: Negative.     Past Medical History:  Diagnosis Date  . Medical history non-contributory     Past Surgical History:  Procedure Laterality Date  . APPENDECTOMY  age 13  . CESAREAN SECTION N/A 08/13/2012   Procedure: CESAREAN SECTION;  Surgeon: Meriel Pica, MD;  Location: [facility];  Service: Obstetrics;  Laterality: N/A;  Primary Cesarean Section Delivery Baby Girl  @ [phone],    Apgars 9/9  . MOUTH SURGERY     wisdom teeth      Current Outpatient Medications:  .  cephALEXin (KEFLEX) 500 MG capsule, Take 1 capsule (500 mg total) by mouth 4 (four) times daily for 5 days., Disp: 20 capsule, Rfl: 0 .  HYDROcodone-acetaminophen  (NORCO) 5-325 MG tablet, Take 1 tablet by mouth every 6 (six) hours as needed for moderate pain., Disp: 30 tablet, Rfl: 0 .  ibuprofen (ADVIL,MOTRIN) 600 MG tablet, Take 1 tablet (600 mg total) by mouth every 6 (six) hours as needed., Disp: 30 tablet, Rfl: 0 .  ondansetron (ZOFRAN) 4 MG tablet, Take 1 tablet (4 mg total) by mouth every 8 (eight) hours as needed for up to 5 days., Disp: 15 tablet, Rfl: 0 .  oxyCODONE-acetaminophen (PERCOCET/ROXICET) 5-325 MG per tablet, Take 1-2 tablets by mouth every 6 (six) hours as needed., Disp: 30 tablet, Rfl: 0 .  Prenatal Vit-Fe Fumarate-FA (PRENATAL MULTIVITAMIN) TABS, Take 1 tablet by mouth daily., Disp: 30 tablet, Rfl: 12   Objective:   Vitals:   09/01/18 0827  BP: 123/80  Pulse: 83  Temp: 99 F (37.2 C)  SpO2: 99%    Physical Exam Vitals signs and nursing note reviewed.  Constitutional:      Appearance: Normal appearance.  HENT:     Head: Normocephalic and atraumatic.     Nose: Nose normal.     Mouth/Throat:     Mouth: Mucous membranes are moist.  Eyes:     Extraocular Movements: Extraocular movements intact.     Pupils: Pupils are equal, round, and reactive to light.  Cardiovascular:  Rate and Rhythm: Normal rate.  Pulmonary:     Effort: Pulmonary effort is normal. No respiratory distress.  Abdominal:     General: Abdomen is flat. There is no distension.     Tenderness: There is no abdominal tenderness. There is no guarding.  Skin:    General: Skin is warm.  Neurological:     General: No focal deficit present.     Mental Status: She is alert.  Psychiatric:        Mood and Affect: Mood normal.        Thought Content: Thought content normal.        Judgment: Judgment normal.     Assessment & Plan:  Symptomatic mammary hypertrophy  Neck pain  Chronic bilateral thoracic back pain  Plan for bilateral breast reduction. Prescriptions sent to pharmacy.  The risk that can be encountered with breast reduction were  discussed and include the following but not limited to these:  Breast asymmetry, fluid accumulation, firmness of the breast, inability to breast feed, loss of nipple or areola, skin loss, decrease or no nipple sensation, fat necrosis of the breast tissue, bleeding, infection, healing delay.  There are risks of anesthesia, changes to skin sensation and injury to nerves or blood vessels.  The muscle can be temporarily or permanently injured.  You may have an allergic reaction to tape, suture, glue, blood products which can result in skin discoloration, swelling, pain, skin lesions, poor healing.  Any of these can lead to the need for revisonal surgery or stage procedures.  A reduction has potential to interfere with diagnostic procedures.  Nipple or breast piercing can increase risks of infection.  This procedure is best done when the breast is fully developed.  Changes in the breast will continue to occur over time.  Pregnancy can alter the outcomes of previous breast reduction surgery, weight gain and weigh loss can also effect the long term appearance.  Alena Bills Dillingham, DO

## 2018-09-04 HISTORY — PX: REDUCTION MAMMAPLASTY: SUR839

## 2018-09-17 ENCOUNTER — Ambulatory Visit (HOSPITAL_BASED_OUTPATIENT_CLINIC_OR_DEPARTMENT_OTHER): Admit: 2018-09-17 | Payer: BLUE CROSS/BLUE SHIELD | Admitting: Plastic Surgery

## 2018-09-17 ENCOUNTER — Encounter (HOSPITAL_BASED_OUTPATIENT_CLINIC_OR_DEPARTMENT_OTHER): Payer: Self-pay

## 2018-09-17 SURGERY — MAMMOPLASTY, REDUCTION
Anesthesia: General | Site: Breast | Laterality: Bilateral

## 2018-09-24 ENCOUNTER — Encounter: Payer: Self-pay | Admitting: Plastic Surgery

## 2019-08-30 ENCOUNTER — Other Ambulatory Visit: Payer: Self-pay | Admitting: Obstetrics and Gynecology

## 2019-08-30 DIAGNOSIS — N6320 Unspecified lump in the left breast, unspecified quadrant: Secondary | ICD-10-CM

## 2019-09-14 ENCOUNTER — Other Ambulatory Visit: Payer: BLUE CROSS/BLUE SHIELD

## 2019-09-16 ENCOUNTER — Other Ambulatory Visit: Payer: Self-pay | Admitting: Obstetrics and Gynecology

## 2019-09-16 ENCOUNTER — Ambulatory Visit
Admission: RE | Admit: 2019-09-16 | Discharge: 2019-09-16 | Disposition: A | Discharge status: Home / Self Care | Payer: BLUE CROSS/BLUE SHIELD | Source: Ambulatory Visit | Attending: Obstetrics and Gynecology | Admitting: Obstetrics and Gynecology

## 2019-09-16 ENCOUNTER — Ambulatory Visit
Admission: RE | Admit: 2019-09-16 | Discharge: 2019-09-16 | Disposition: A | Discharge status: Home / Self Care | Payer: BC Managed Care – PPO | Source: Ambulatory Visit | Attending: Obstetrics and Gynecology | Admitting: Obstetrics and Gynecology

## 2019-09-16 ENCOUNTER — Other Ambulatory Visit: Payer: Self-pay

## 2019-09-16 DIAGNOSIS — R928 Other abnormal and inconclusive findings on diagnostic imaging of breast: Secondary | ICD-10-CM

## 2019-09-16 DIAGNOSIS — N6320 Unspecified lump in the left breast, unspecified quadrant: Secondary | ICD-10-CM

## 2020-05-01 ENCOUNTER — Other Ambulatory Visit: Payer: Self-pay | Admitting: Family Medicine

## 2020-05-01 DIAGNOSIS — G44309 Post-traumatic headache, unspecified, not intractable: Secondary | ICD-10-CM
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
## 2020-05-22 ENCOUNTER — Other Ambulatory Visit: Payer: Self-pay

## 2020-05-22 ENCOUNTER — Ambulatory Visit
Admission: RE | Admit: 2020-05-22 | Discharge: 2020-05-22 | Disposition: A | Discharge status: Home / Self Care | Payer: BC Managed Care – PPO | Source: Ambulatory Visit | Attending: Family Medicine | Admitting: Family Medicine

## 2020-05-22 DIAGNOSIS — G44309 Post-traumatic headache, unspecified, not intractable: Secondary | ICD-10-CM

## 2020-05-23 ENCOUNTER — Other Ambulatory Visit: Payer: Self-pay | Admitting: Family Medicine

## 2020-05-23 DIAGNOSIS — G9389 Other specified disorders of brain: Secondary | ICD-10-CM

## 2020-05-25 ENCOUNTER — Ambulatory Visit
Admission: RE | Admit: 2020-05-25 | Discharge: 2020-05-25 | Disposition: A | Discharge status: Home / Self Care | Payer: BC Managed Care – PPO | Source: Ambulatory Visit | Attending: Family Medicine | Admitting: Family Medicine

## 2020-05-25 DIAGNOSIS — G9389 Other specified disorders of brain: Secondary | ICD-10-CM

## 2020-05-25 MED ORDER — GADOBENATE DIMEGLUMINE 529 MG/ML IV SOLN
13.0000 mL | Freq: Once | INTRAVENOUS | Status: AC | PRN
  Administered 2020-05-25: 13 mL via INTRAVENOUS

## 2022-07-29 ENCOUNTER — Other Ambulatory Visit: Payer: Self-pay | Admitting: Obstetrics and Gynecology

## 2022-07-29 DIAGNOSIS — N6002 Solitary cyst of left breast: Secondary | ICD-10-CM

## 2022-09-11 ENCOUNTER — Ambulatory Visit
Admission: RE | Admit: 2022-09-11 | Discharge: 2022-09-11 | Disposition: A | Discharge status: Home / Self Care | Payer: BC Managed Care – PPO | Source: Ambulatory Visit | Attending: Obstetrics and Gynecology | Admitting: Obstetrics and Gynecology

## 2022-09-11 ENCOUNTER — Ambulatory Visit
Admission: RE | Admit: 2022-09-11 | Discharge: 2022-09-11 | Disposition: A | Discharge status: Home / Self Care | Payer: 59 | Source: Ambulatory Visit | Attending: Obstetrics and Gynecology | Admitting: Obstetrics and Gynecology

## 2022-09-11 ENCOUNTER — Other Ambulatory Visit: Payer: Self-pay | Admitting: Obstetrics and Gynecology

## 2022-09-11 DIAGNOSIS — N6002 Solitary cyst of left breast: Secondary | ICD-10-CM

## 2022-09-18 IMAGING — MR MR HEAD W/ CM
5 series · 48 of 48 positions shown · IV contrast (13ml Multihance)
Comparison: 05/22/2020 MRI head.

CLINICAL DATA: Brain mass or lesion

EXAM:
MRI HEAD WITH CONTRAST
TECHNIQUE: Multiplanar, multiecho pulse sequences of the brain and surrounding
structures were obtained with intravenous contrast.
CONTRAST:  13mL MULTIHANCE GADOBENATE DIMEGLUMINE 529 MG/ML IV SOLN

[Series 2: t1_mpr_tra · axial · 1.0mm · 0.72mm/px · z∈[-81,+78]mm · 20 of 160 slices shown]
[im 1/160]
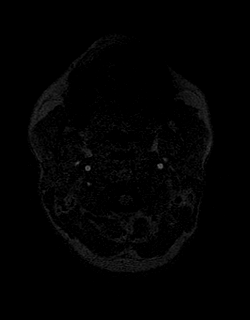
[im 9/160]
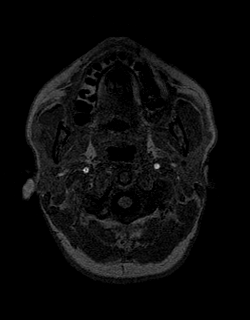
[im 17/160]
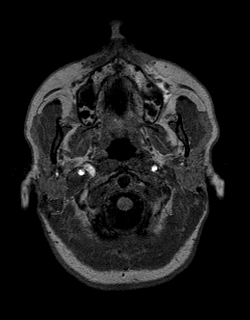
[im 26/160]
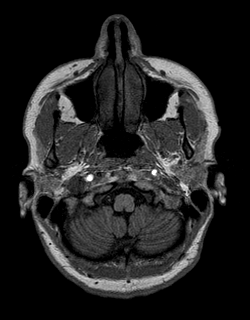
[im 34/160]
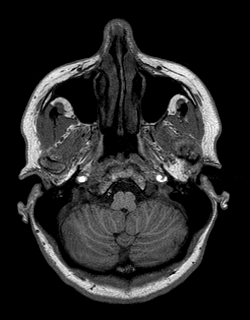
[im 42/160]
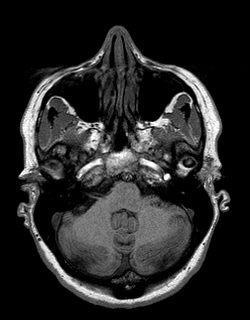
[im 51/160]
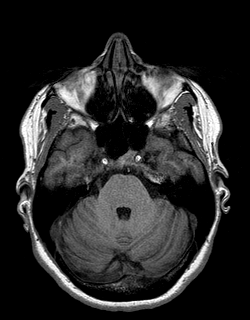
[im 59/160]
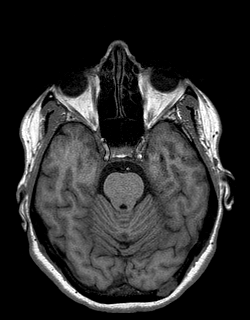
[im 67/160]
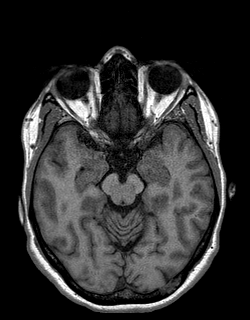
[im 76/160]
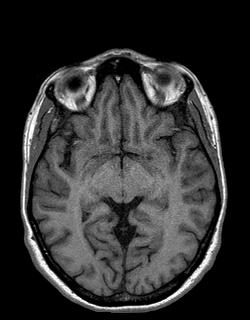
[im 84/160]
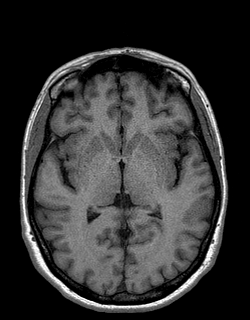
[im 93/160]
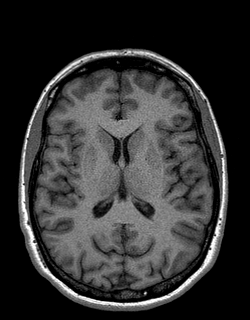
[im 101/160]
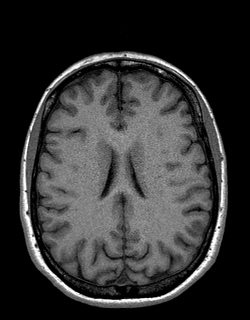
[im 109/160]
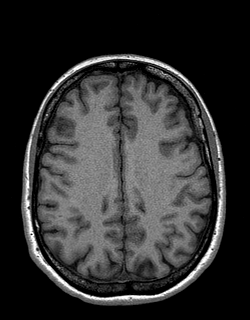
[im 118/160]
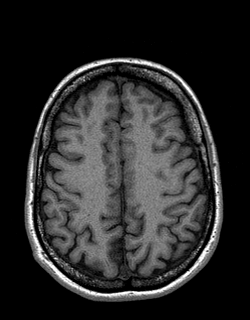
[im 126/160]
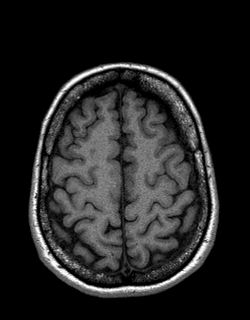
[im 134/160]
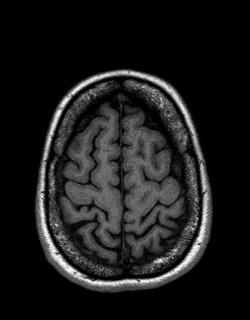
[im 143/160]
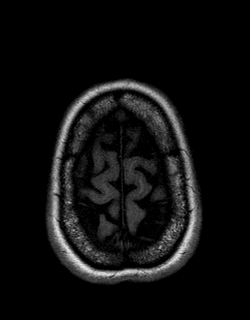
[im 151/160]
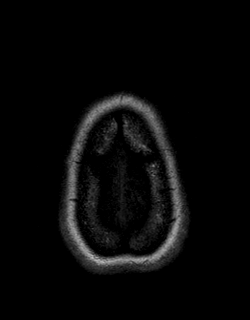
[im 160/160]
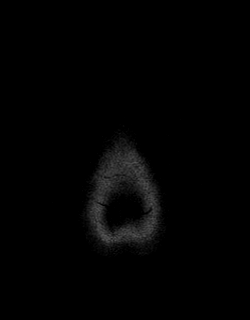

[Series 4: T1 post-contrast · coronal · 5.0mm · 0.72mm/px · 3 of 28 slices shown (1 of 2)]
[im 1/28]
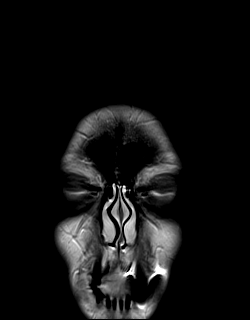
[im 14/28]
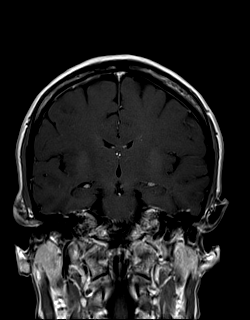
[im 28/28]
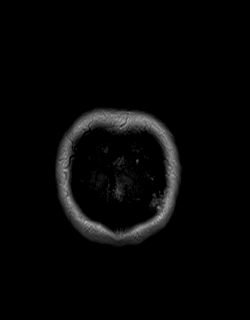

[Series 5: T2 · coronal · 5.0mm · 0.45mm/px · 3 of 28 slices shown]
[im 1/28]
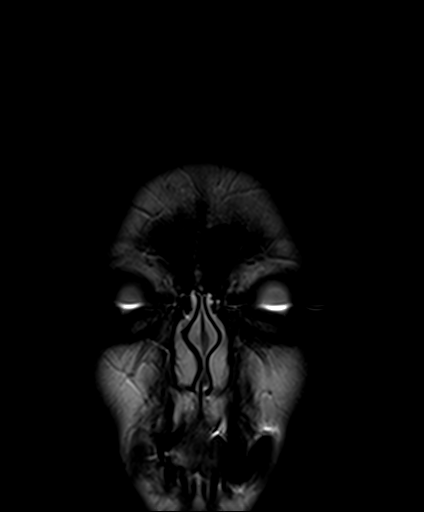
[im 14/28]
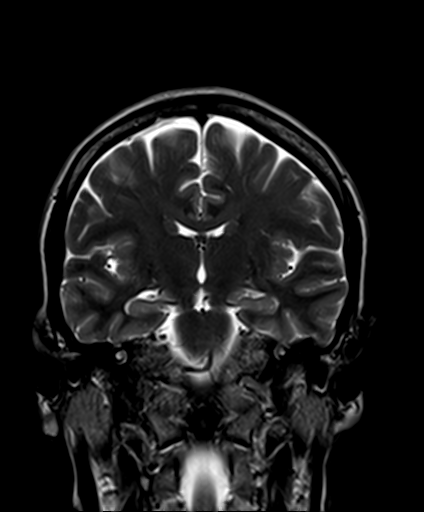
[im 28/28]
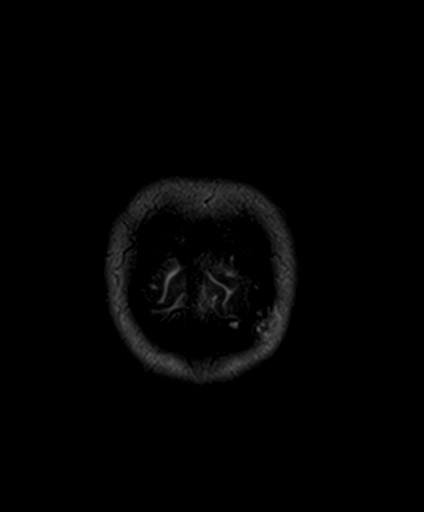

[Series 6: post t1_mpr_tra · axial · 1.0mm · 0.72mm/px · z∈[-81,+78]mm · 19 of 160 slices shown]
[im 1/160]
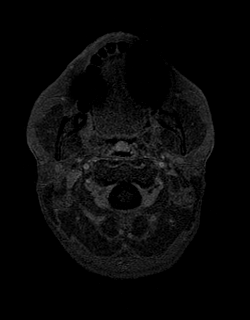
[im 9/160]
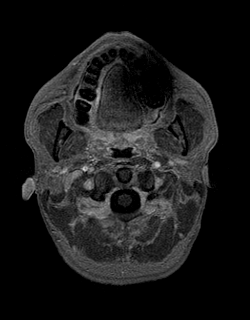
[im 18/160]
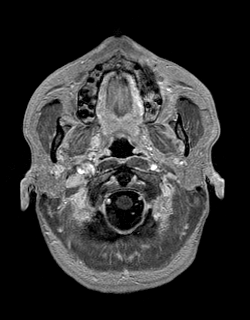
[im 27/160]
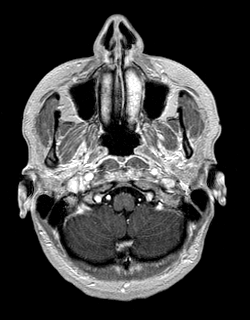
[im 36/160]
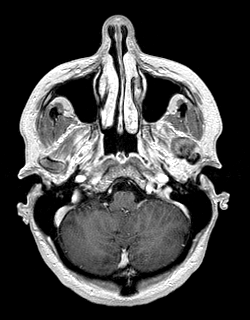
[im 45/160]
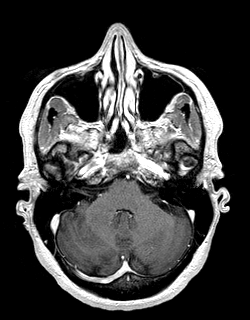
[im 54/160]
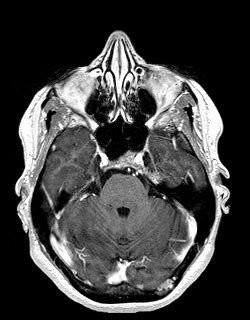
[im 62/160]
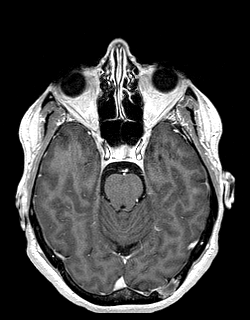
[im 71/160]
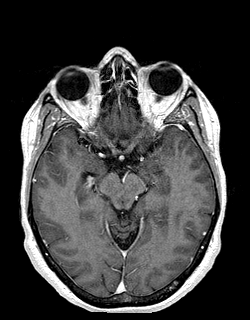
[im 80/160]
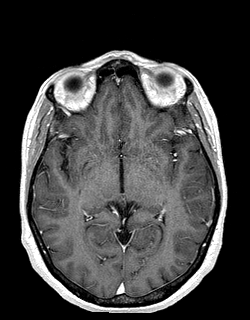
[im 89/160]
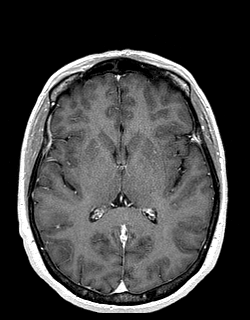
[im 98/160]
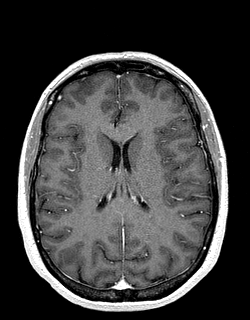
[im 107/160]
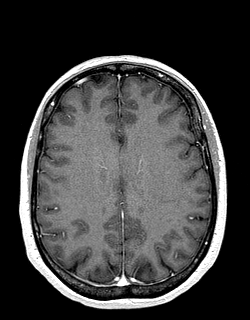
[im 115/160]
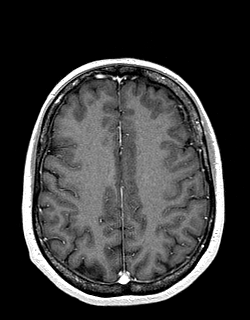
[im 124/160]
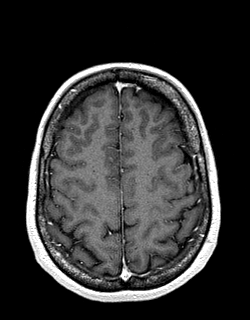
[im 133/160]
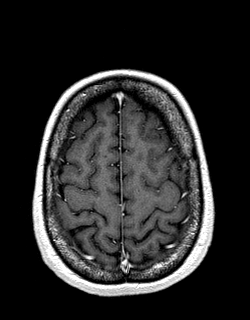
[im 142/160]
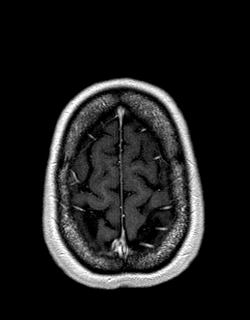
[im 151/160]
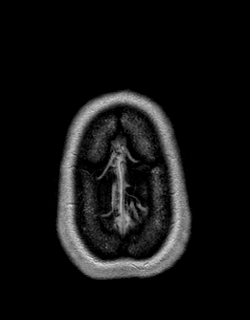
[im 160/160]
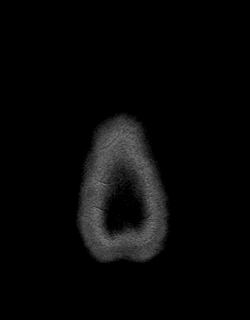

[Series 7: T1 post-contrast · sagittal · 5.0mm · 0.45mm/px · 3 of 21 slices shown (2 of 2)]
[im 1/21]
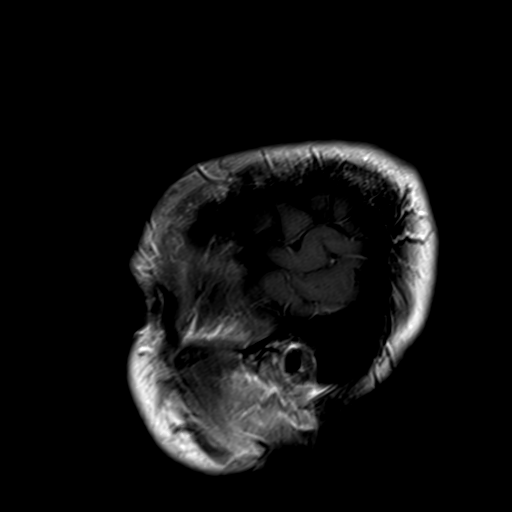
[im 11/21]
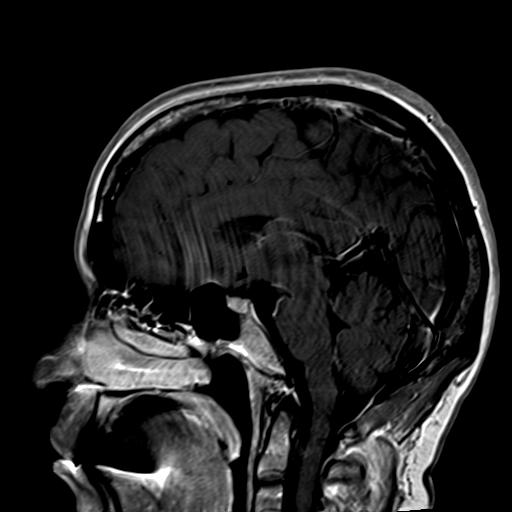
[im 21/21]
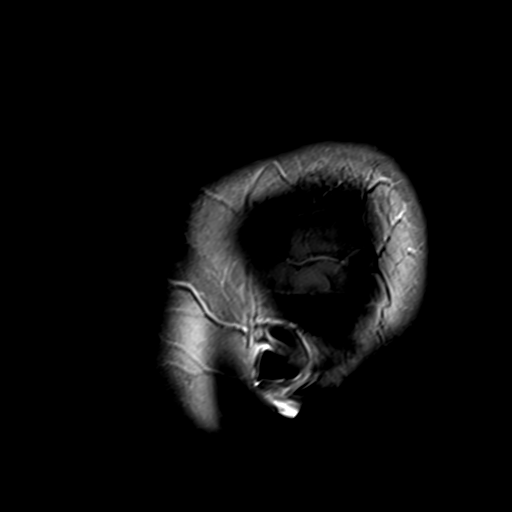

[48 of 48 positions shown; findings below may reference images not displayed]

FINDINGS: Brain: No diffusion-weighted signal abnormality. No intracranial
hemorrhage. No midline shift, ventriculomegaly or extra-axial fluid
collection. No mass lesion. No abnormal enhancement involving the
brain parenchyma.

Vascular: Normal flow voids.

Skull and upper cervical spine: Redemonstration of 1.9 cm left
occipital calvarial lesion demonstrating heterogenous enhancement.

Sinuses/Orbits: Normal orbits. Clear paranasal sinuses. No mastoid
effusion.

Other: None.
IMPRESSION: 1. No acute intracranial process. No abnormal enhancement involving
the brain parenchyma.
2. 1.9 cm left occipital calvarial lesion likely reflects a benign
hemangioma.

## 2023-04-29 ENCOUNTER — Other Ambulatory Visit: Payer: Self-pay | Admitting: Family Medicine

## 2023-04-29 DIAGNOSIS — G9389 Other specified disorders of brain: Secondary | ICD-10-CM

## 2023-04-29 NOTE — Progress Notes (Signed)
Planned repeat MRI from prior 3 years ago to assess change in hemangioma

## 2023-06-14 ENCOUNTER — Inpatient Hospital Stay: Admission: RE | Admit: 2023-06-14 | Payer: 59 | Source: Ambulatory Visit

## 2023-09-18 ENCOUNTER — Other Ambulatory Visit: Payer: Self-pay | Admitting: Obstetrics and Gynecology

## 2023-09-18 DIAGNOSIS — R928 Other abnormal and inconclusive findings on diagnostic imaging of breast: Secondary | ICD-10-CM

## 2023-10-03 ENCOUNTER — Ambulatory Visit
Admission: RE | Admit: 2023-10-03 | Discharge: 2023-10-03 | Disposition: A | Discharge status: Home / Self Care | Source: Ambulatory Visit | Attending: Obstetrics and Gynecology | Admitting: Obstetrics and Gynecology

## 2023-10-03 DIAGNOSIS — R928 Other abnormal and inconclusive findings on diagnostic imaging of breast: Secondary | ICD-10-CM

## 2024-05-13 ENCOUNTER — Other Ambulatory Visit: Payer: Self-pay | Admitting: Family Medicine

## 2024-05-13 DIAGNOSIS — G9389 Other specified disorders of brain: Secondary | ICD-10-CM

## 2024-05-13 NOTE — Progress Notes (Signed)
 H/o Brain mass noted on imaging in 2021. Pt wanting eval to determine stability of mass. Concerned about change.   MRI ordered.

## 2024-10-03 ENCOUNTER — Other Ambulatory Visit
